# Patient Record
Sex: Male | Born: 1945 | Race: White | Hispanic: No | Marital: Married
Health system: Southern US, Community
[De-identification: ages and names within clinical notes are randomized; demographics above are authoritative.]

## PROBLEM LIST (undated history)

## (undated) DIAGNOSIS — I10 Essential (primary) hypertension: Secondary | ICD-10-CM

## (undated) DIAGNOSIS — R42 Dizziness and giddiness: Secondary | ICD-10-CM

## (undated) HISTORY — DX: Dizziness and giddiness: R42

## (undated) HISTORY — DX: Essential (primary) hypertension: I10

---

## 2004-07-21 ENCOUNTER — Ambulatory Visit: Payer: Self-pay | Admitting: Internal Medicine

## 2007-07-08 ENCOUNTER — Other Ambulatory Visit: Payer: Self-pay

## 2007-07-08 ENCOUNTER — Emergency Department: Payer: Self-pay | Admitting: Emergency Medicine

## 2009-02-19 IMAGING — CT CT HEAD WITHOUT CONTRAST
2 series · 15 of 30 positions shown, 19 images · non-contrast
Comparison: none

REASON FOR EXAM: Sinus Congestion and Headache
COMMENTS:   LMP: Post-Menopausal

[Series 2: without · axial · non-contrast · 0.40mm/px · z∈[+870,+990]mm · 13 of 29 slices shown, 17 images]
[im 3/29  brain]
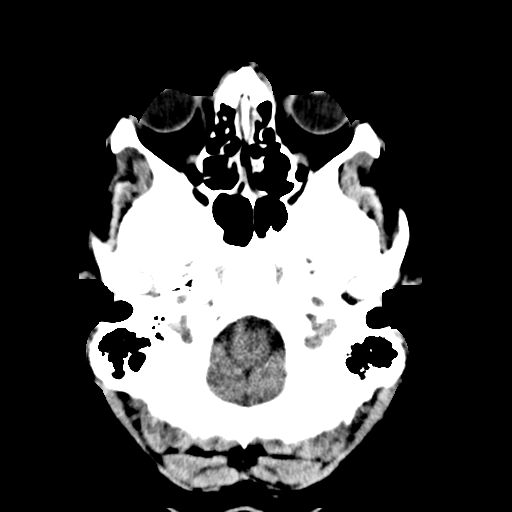
[im 3/29  bone]
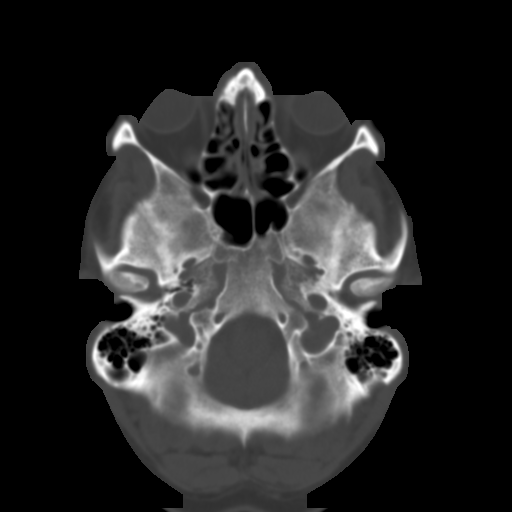
[im 5/29  brain]
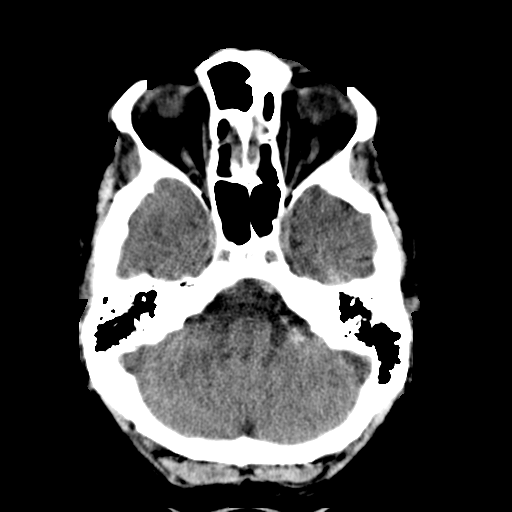
[im 7/29  brain]
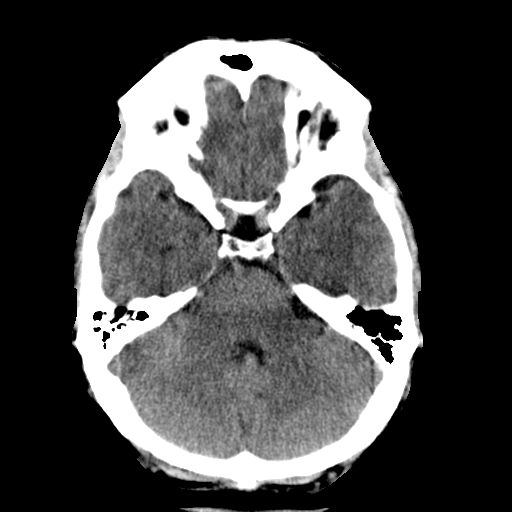
[im 9/29  brain]
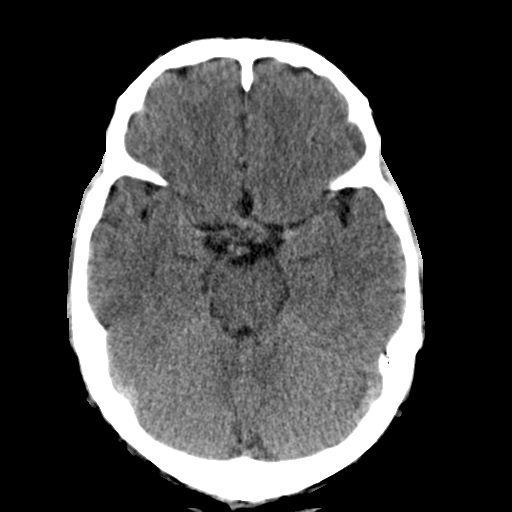
[im 11/29  brain]
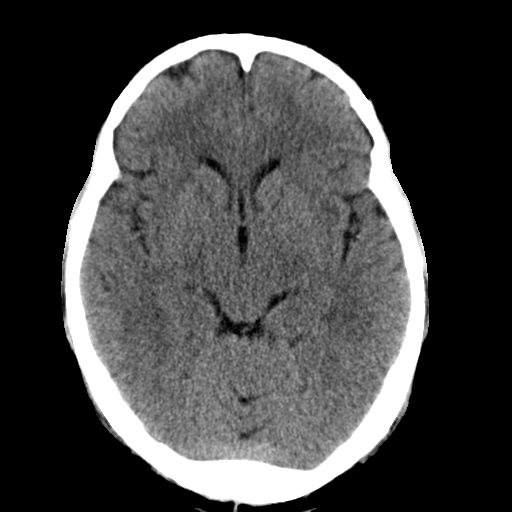
[im 11/29  bone]
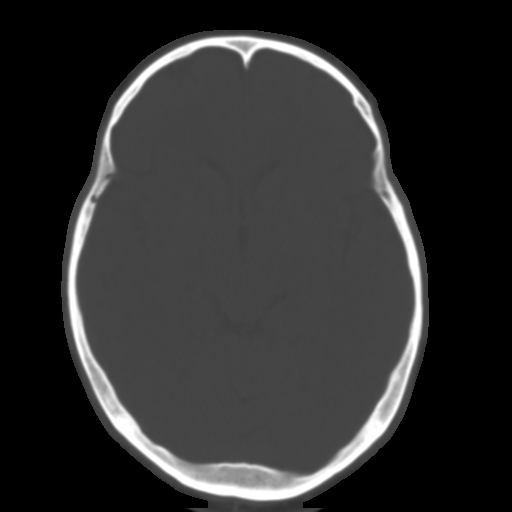
[im 13/29  brain]
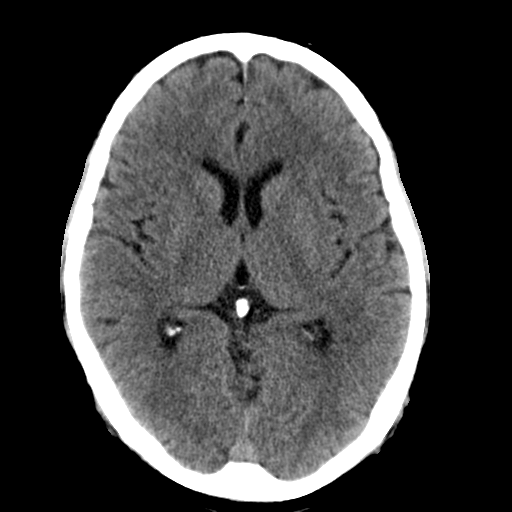
[im 15/29  brain]
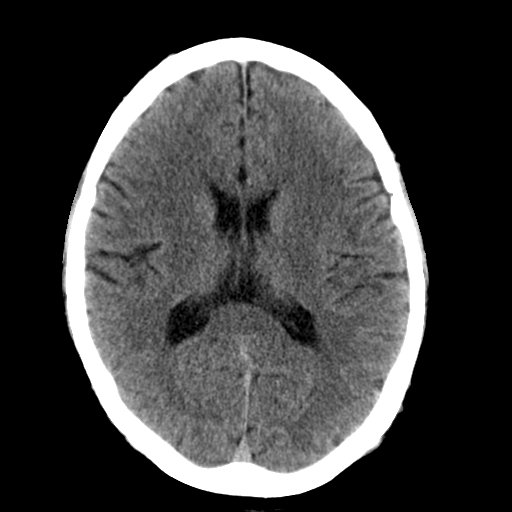
[im 17/29  brain]
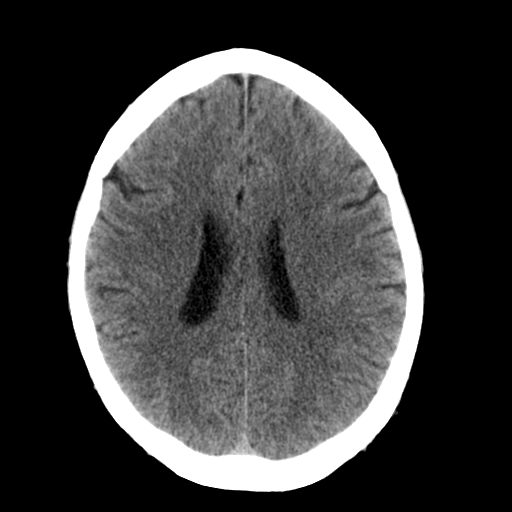
[im 19/29  brain]
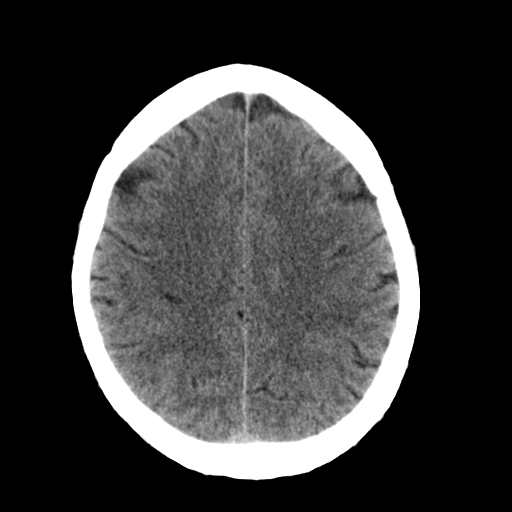
[im 19/29  bone]
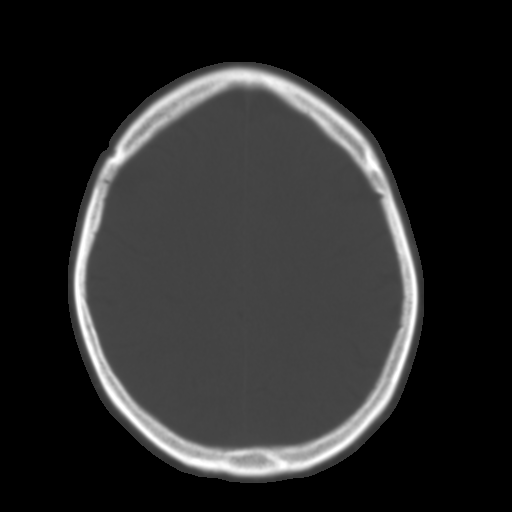
[im 21/29  brain]
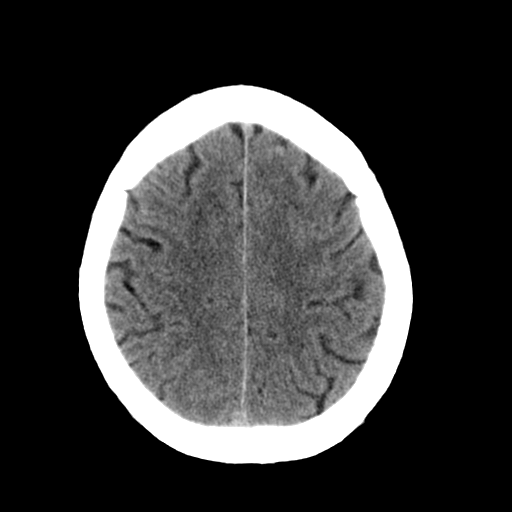
[im 23/29  brain]
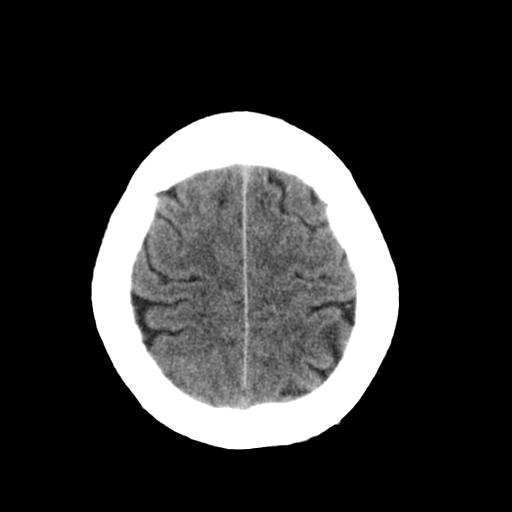
[im 25/29  brain]
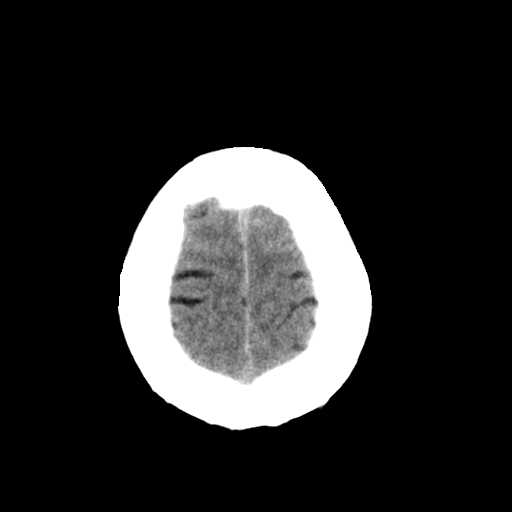
[im 27/29  brain]
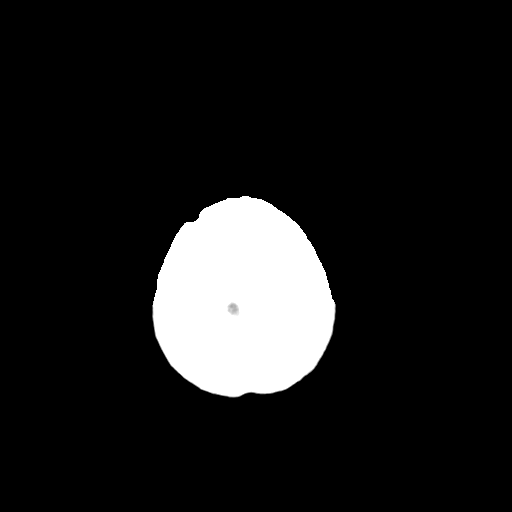
[im 27/29  bone]
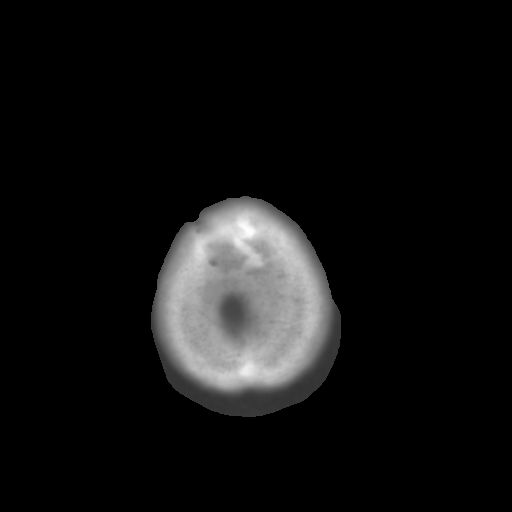

[Series 3: bone · axial · 0.40mm/px · z∈[+870,+890]mm · 2 of 29 slices shown]
[im 3/29  bone]
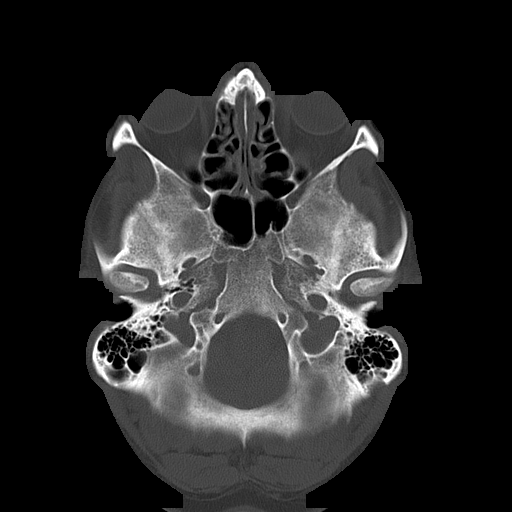
[im 7/29  bone]
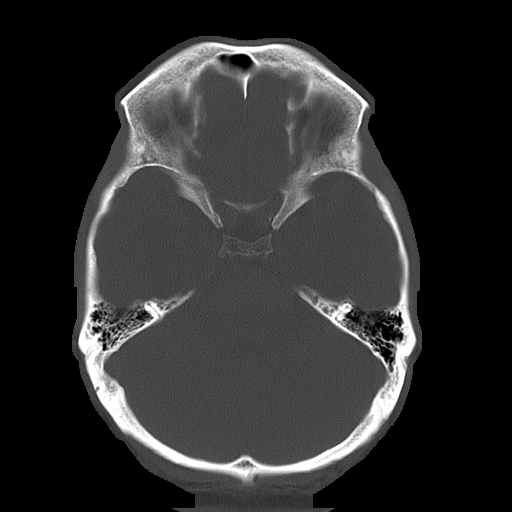

[15 of 30 positions shown; findings below may reference images not displayed]

PROCEDURE:     CT  - CT HEAD WITHOUT CONTRAST  - July 08, 2007  [DATE]

RESULT:     Emergent noncontrast CT of the brain is performed in the
standard fashion. The patient has no prior study for comparison.

 The ventricles and sulci are normal. There is no hemorrhage. There is no
focal mass, mass-effect or midline shift. There is no evidence of edema or
territorial infarct. The bone windows demonstrate normal aeration of the
paranasal sinuses and mastoid air cells. There is no skull fracture
demonstrated. There is some very mild mucosal thickening in the ethmoid
sinuses without evidence of an air-fluid level or complete opacification.
The appearance is nonspecific.
IMPRESSION: 1. No acute intracranial abnormality.
2. Mucosal thickening in the ethmoid sinuses bilaterally.
3. Incidental note is made of an aplastic left frontal sinus.

## 2010-09-25 ENCOUNTER — Emergency Department: Payer: Self-pay | Admitting: Emergency Medicine

## 2011-01-20 ENCOUNTER — Ambulatory Visit: Payer: Self-pay | Admitting: Family Medicine

## 2011-04-09 ENCOUNTER — Emergency Department: Payer: Self-pay | Admitting: Emergency Medicine

## 2012-01-03 ENCOUNTER — Ambulatory Visit: Payer: Self-pay

## 2012-11-08 ENCOUNTER — Ambulatory Visit: Payer: Self-pay | Admitting: Family Medicine

## 2014-05-22 ENCOUNTER — Emergency Department: Payer: Self-pay | Admitting: Emergency Medicine

## 2014-05-22 LAB — COMPREHENSIVE METABOLIC PANEL
ANION GAP: 5 — AB (ref 7–16)
Albumin: 3.9 g/dL (ref 3.4–5.0)
Alkaline Phosphatase: 68 U/L
BUN: 10 mg/dL (ref 7–18)
Bilirubin,Total: 0.5 mg/dL (ref 0.2–1.0)
CHLORIDE: 109 mmol/L — AB (ref 98–107)
CO2: 25 mmol/L (ref 21–32)
CREATININE: 1.16 mg/dL (ref 0.60–1.30)
Calcium, Total: 8.1 mg/dL — ABNORMAL LOW (ref 8.5–10.1)
GLUCOSE: 105 mg/dL — AB (ref 65–99)
Osmolality: 277 (ref 275–301)
POTASSIUM: 3.9 mmol/L (ref 3.5–5.1)
SGOT(AST): 31 U/L (ref 15–37)
SGPT (ALT): 29 U/L
SODIUM: 139 mmol/L (ref 136–145)
TOTAL PROTEIN: 7.2 g/dL (ref 6.4–8.2)

## 2014-05-22 LAB — CBC
HCT: 41.8 % (ref 40.0–52.0)
HGB: 13.9 g/dL (ref 13.0–18.0)
MCH: 30.7 pg (ref 26.0–34.0)
MCHC: 33.2 g/dL (ref 32.0–36.0)
MCV: 92 fL (ref 80–100)
Platelet: 205 10*3/uL (ref 150–440)
RBC: 4.52 10*6/uL (ref 4.40–5.90)
RDW: 12.8 % (ref 11.5–14.5)
WBC: 13.6 10*3/uL — ABNORMAL HIGH (ref 3.8–10.6)

## 2014-05-22 LAB — TROPONIN I

## 2016-01-04 IMAGING — CT CT HEAD WITHOUT CONTRAST
1 series · 16 of 30 positions shown, 20 images · non-contrast
Comparison: None.

CLINICAL DATA: Sudden onset of dizziness, nausea and vomiting

EXAM:
CT HEAD WITHOUT CONTRAST
TECHNIQUE: Contiguous axial images were obtained from the base of the skull
through the vertex without intravenous contrast.

[Series 2: head wo · axial · 0.45mm/px · z∈[+171,+315]mm · 16 of 36 slices shown, 20 images]
[im 2/36  brain]
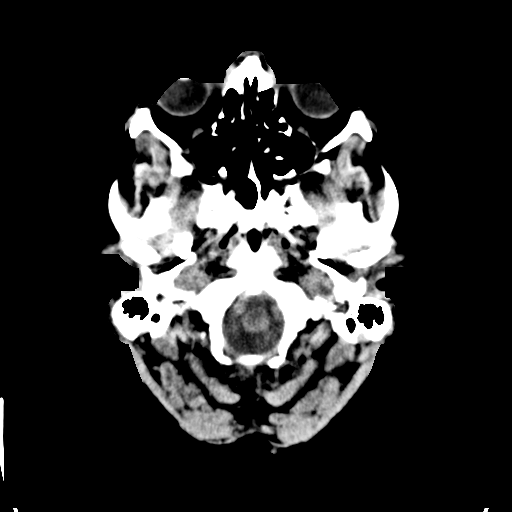
[im 2/36  bone]
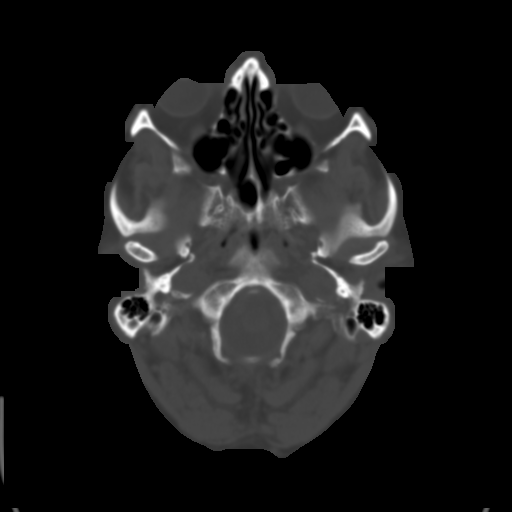
[im 4/36  brain]
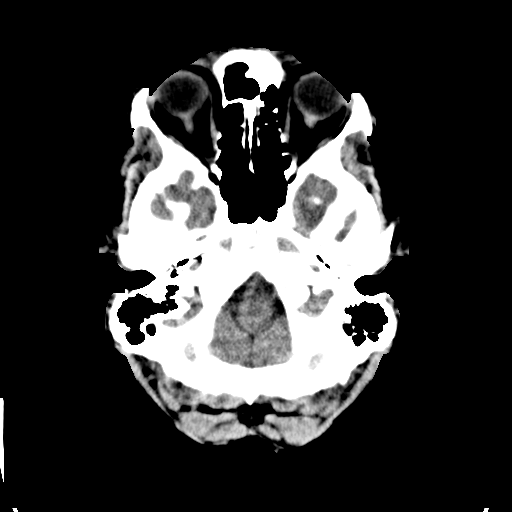
[im 7/36  brain]
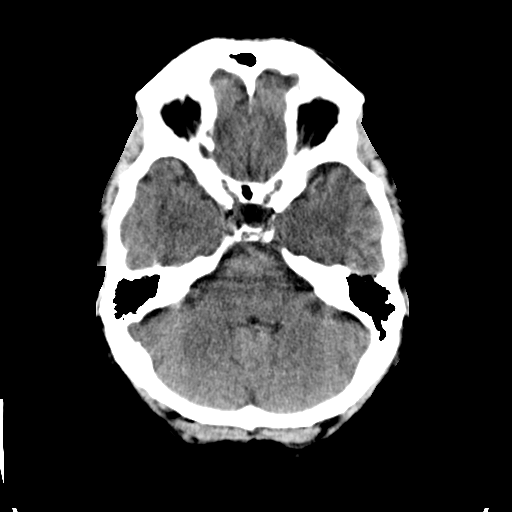
[im 9/36  brain]
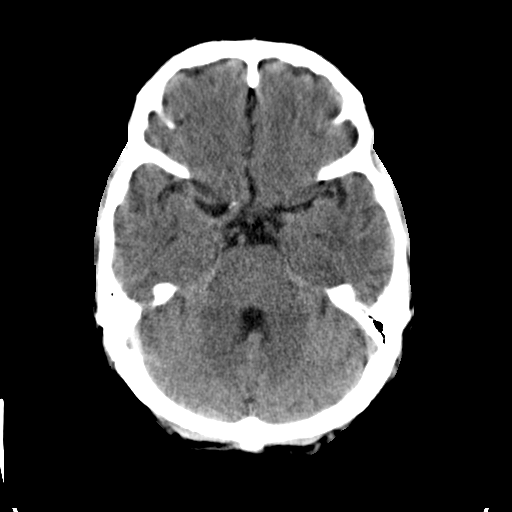
[im 10/36  brain]
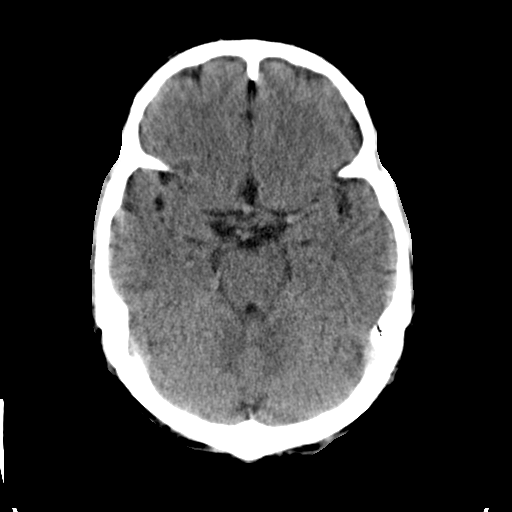
[im 10/36  bone]
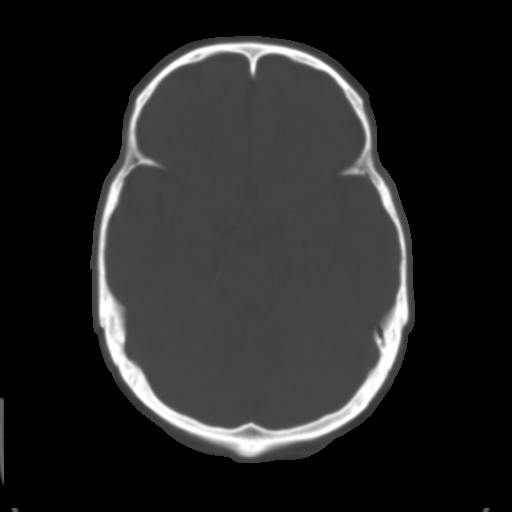
[im 13/36  brain]
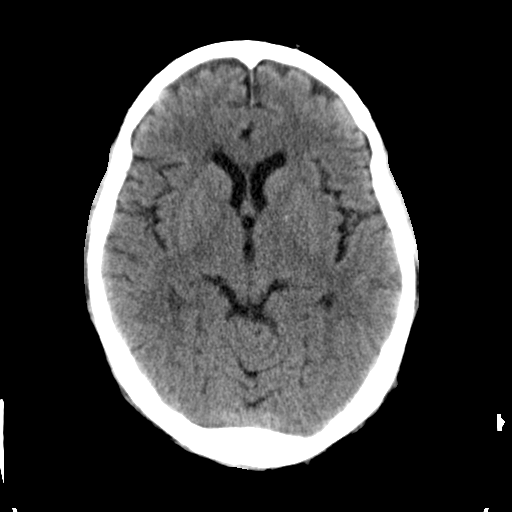
[im 15/36  brain]
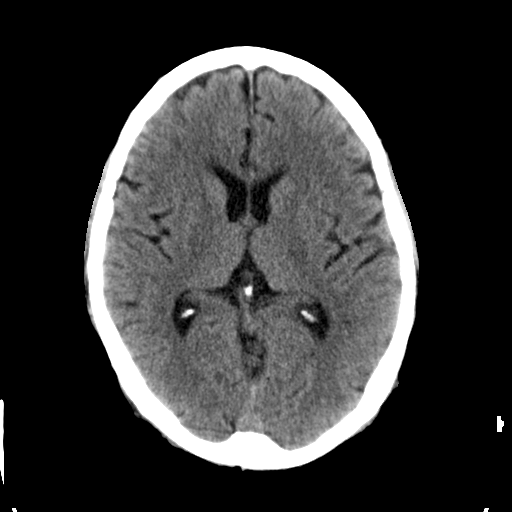
[im 17/36  brain]
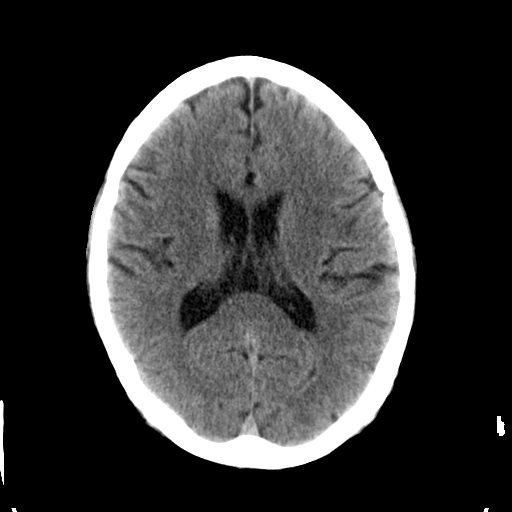
[im 19/36  brain]
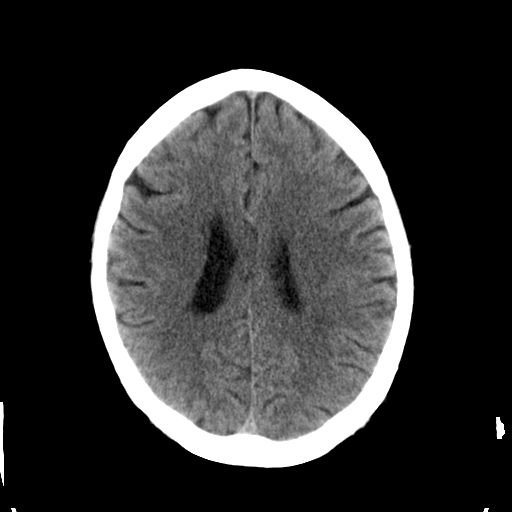
[im 19/36  bone]
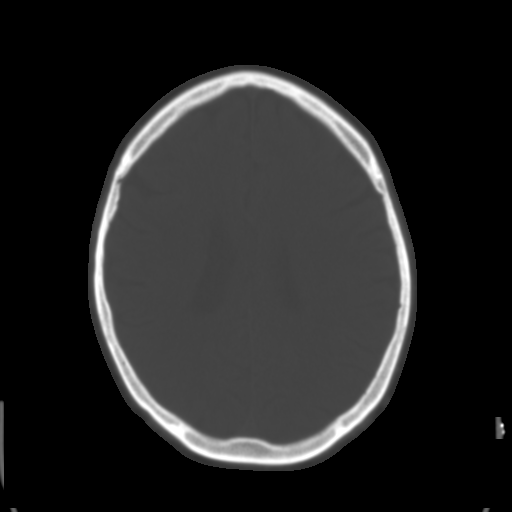
[im 21/36  brain]
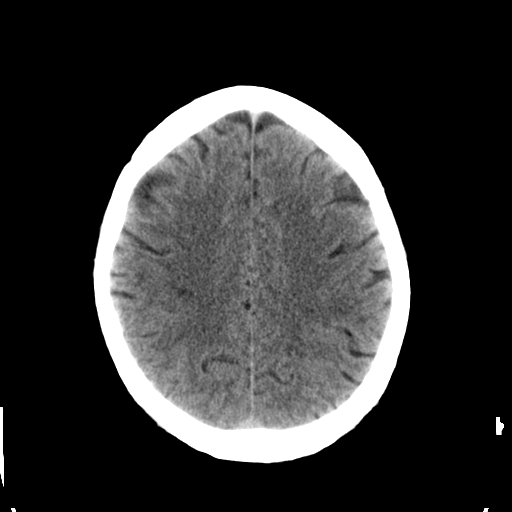
[im 23/36  brain]
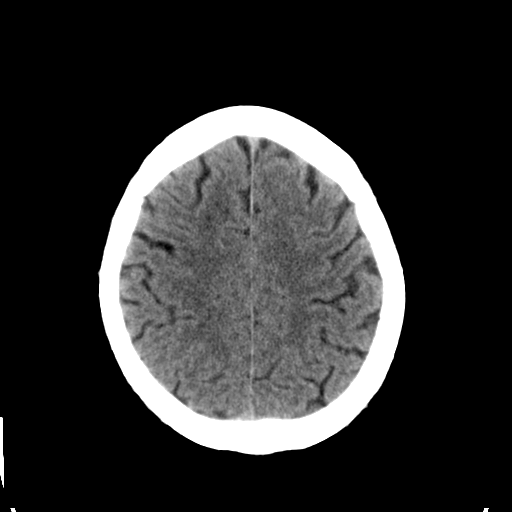
[im 26/36  brain]
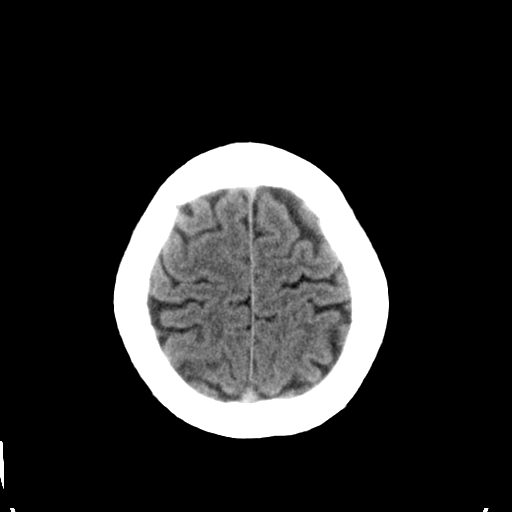
[im 27/36  brain]
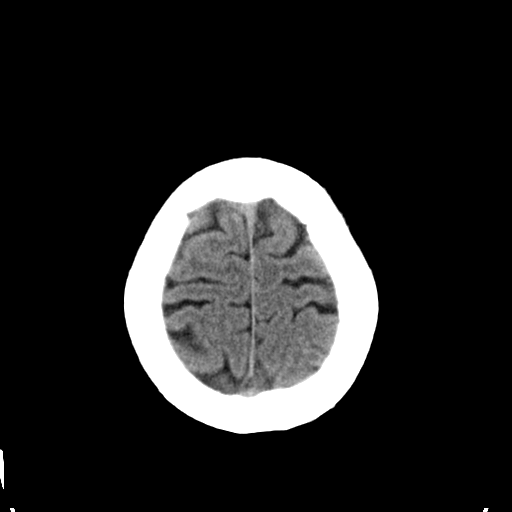
[im 27/36  bone]
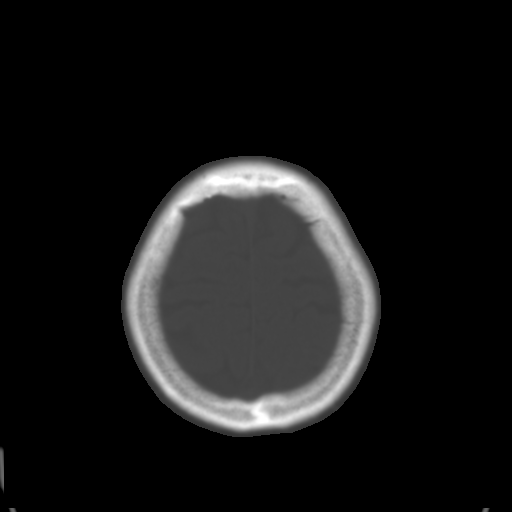
[im 29/36  brain]
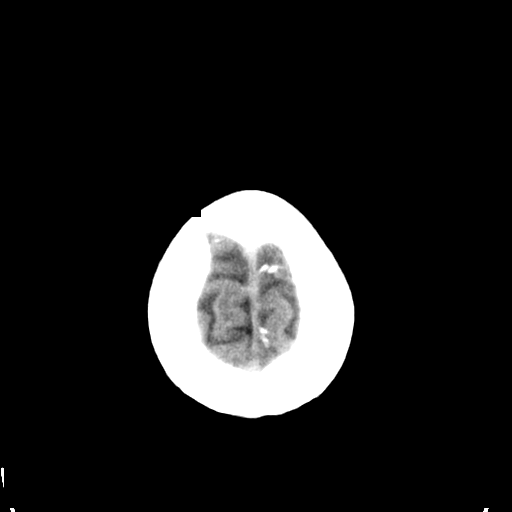
[im 32/36  brain]
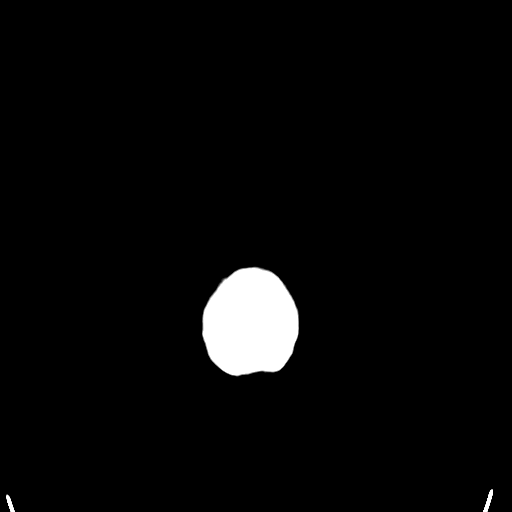
[im 34/36  brain]
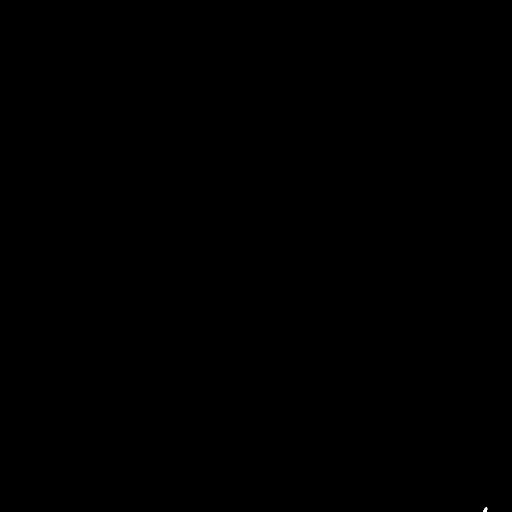

[16 of 30 positions shown; findings below may reference images not displayed]

FINDINGS: No acute hemorrhage, infarct, or mass lesion is identified. No
midline shift. Ventricles are normal in size. Orbits and paranasal
sinuses are unremarkable. No skull fracture.
IMPRESSION: No acute intracranial finding.

## 2019-08-30 ENCOUNTER — Ambulatory Visit: Payer: Medicare Other | Attending: Internal Medicine

## 2019-08-30 DIAGNOSIS — Z23 Encounter for immunization: Secondary | ICD-10-CM | POA: Insufficient documentation

## 2019-08-30 NOTE — Progress Notes (Signed)
   Covid-19 Vaccination Clinic  Name:  Carlos Howard    MRN: [id number] DOB: Dec 22, 1945  08/30/2019  [name] was observed post Covid-19 immunization for 15 minutes without incidence. He was provided with Vaccine Information Sheet and instruction to access the V-Safe system.   [name] was instructed to call 911 with any severe reactions post vaccine: Marland Kitchen Difficulty breathing  . Swelling of your face and throat  . A fast heartbeat  . A bad rash all over your body  . Dizziness and weakness    Immunizations Administered    Name Date Dose VIS Date Route   Pfizer COVID-19 Vaccine 08/30/2019 10:25 AM 0.3 mL 06/26/2019 Intramuscular   Manufacturer: [employer], [employer]   Lot: [id number]   NDC: [address]-3

## 2019-09-24 ENCOUNTER — Ambulatory Visit: Payer: Medicare Other | Attending: Internal Medicine

## 2019-09-24 DIAGNOSIS — Z20822 Contact with and (suspected) exposure to covid-19: Secondary | ICD-10-CM

## 2019-09-25 LAB — NOVEL CORONAVIRUS, NAA: SARS-CoV-2, NAA: NOT DETECTED

## 2019-09-29 ENCOUNTER — Ambulatory Visit: Payer: Self-pay

## 2019-10-06 ENCOUNTER — Ambulatory Visit: Payer: Medicare Other | Attending: Internal Medicine

## 2019-10-06 DIAGNOSIS — Z23 Encounter for immunization: Secondary | ICD-10-CM

## 2019-10-06 NOTE — Progress Notes (Signed)
   Covid-19 Vaccination Clinic  Name:  Carlos Howard    MRN: [id number] DOB: 12/03/1945  10/06/2019  [name] was observed post Covid-19 immunization for 15 minutes without incident. He was provided with Vaccine Information Sheet and instruction to access the V-Safe system.   [name] was instructed to call 911 with any severe reactions post vaccine: Marland Kitchen Difficulty breathing  . Swelling of face and throat  . A fast heartbeat  . A bad rash all over body  . Dizziness and weakness   Immunizations Administered    Name Date Dose VIS Date Route   Pfizer COVID-19 Vaccine 10/06/2019  9:22 AM 0.3 mL 06/26/2019 Intramuscular   Manufacturer: [employer], [employer]   Lot: [id number]   NDC: [address]-1

## 2024-08-14 ENCOUNTER — Ambulatory Visit
Admission: RE | Admit: 2024-08-14 | Discharge: 2024-08-14 | Disposition: A | Source: Ambulatory Visit | Attending: Family Medicine

## 2024-08-14 ENCOUNTER — Ambulatory Visit
Admission: RE | Admit: 2024-08-14 | Discharge: 2024-08-14 | Disposition: A | Attending: Family Medicine | Admitting: Family Medicine

## 2024-08-14 ENCOUNTER — Ambulatory Visit (INDEPENDENT_AMBULATORY_CARE_PROVIDER_SITE_OTHER): Admitting: Family Medicine

## 2024-08-14 ENCOUNTER — Encounter: Payer: Self-pay | Admitting: Family Medicine

## 2024-08-14 VITALS — BP 90/54 | HR 80 | Ht 65.0 in | Wt 152.0 lb

## 2024-08-14 DIAGNOSIS — G8929 Other chronic pain: Secondary | ICD-10-CM | POA: Diagnosis not present

## 2024-08-14 DIAGNOSIS — M5442 Lumbago with sciatica, left side: Secondary | ICD-10-CM | POA: Diagnosis not present

## 2024-08-14 MED ORDER — MELOXICAM 15 MG PO TABS: 15.0000 mg | ORAL_TABLET | Freq: Every day | ORAL | 0 refills | Status: AC | PRN

## 2024-08-14 NOTE — Progress Notes (Signed)
 "    Primary Care / Sports Medicine Office Visit  Patient Information:  Patient ID: Carlos Howard, male DOB: 16-Mar-1946 Age: 79 y.o. MRN: [id number]   Carlos Howard is a pleasant 79 y.o. male presenting with the following:  Chief Complaint  Patient presents with   Back Pain    Left low back pain x 3 months. Pain is right above left glute and travels down to top of left thigh with some numbness.    Hip Pain    Left hip pain x 3 months. Patient use to walk 30 minutes daily but now he can only walk about 5-10 minutes. No catching, no locking Numbness in left thigh.     Vitals:   08/14/24 1450  BP: (!) 90/54  Pulse: 80  SpO2: 97%   Vitals:   08/14/24 1450  Weight: 152 lb (68.9 kg)  Height: 5' 5 (1.651 m)   Body mass index is 25.29 kg/m.  No results found.   Independent interpretation of notes and tests performed by another provider:   None  Procedures performed:   None  Pertinent History, Exam, Impression, and Recommendations:   Discussed the use of AI scribe software for clinical note transcription with the patient, who gave verbal consent to proceed.  History of Present Illness   Carlos Howard is a 79 year old male with lumbar spondylosis who presents with progressively worsening left low back, hip, and buttock pain with associated numbness.  Lumbar and Lower Extremity Pain: - Progressive onset of left-sided low back pain radiating to the left hip and buttock since earlier this year - Intermittent numbness in the left leg - Prior episode of right-sided pain last year after lifting an object - Pain is aggravated by walking, typically starting after ten minutes and progressing to severe intensity, resulting in cessation of usual one-mile walks - Sitting does not provide relief - Pain persists throughout the day and is not alleviated by rest - Lying flat at night exacerbates symptoms; sleeps on his side for comfort - Coughing can aggravate the pain - No acute  worsening with other activities that increase intra-abdominal pressure  Functional Impact and Gait Abnormalities: - Unable to complete usual one-mile walks due to pain severity - Wife observes tendency to walk on the inside of his feet - Sometimes sits with one leg up or cross-legged, but does not attribute this to symptoms  Prior Treatments and Response: - Ibuprofen provides only partial symptomatic improvement - Muscle relaxants were ineffective - Chiropractic care, spinal manipulation, and acupuncture provided limited benefit - Chiropractic traction bed provided some relief - No recent imaging for back pain  Remote Injury and Chronic Symptoms: - Remote history of military accident in 66 - Working with the [place] regarding possible overuse injury and contralateral weakness over the past fifty years  Nocturnal Symptoms: - Lying flat at night worsens pain; prefers to sleep on his side  No chest pain, shortness of breath, or palpitations.      Physical Exam LUMBOSACRAL SPINE AND LEFT HIP INSPECTION: Normal lumbar and pelvic alignment without deformity, swelling, or erythema. PALPATION: Spinous processes non-tender to palpation. No focal paraspinal tenderness. RANGE OF MOTION: Lumbar flexion, extension, rotation, and lateral bending preserved without pain reproduction. STRENGTH: 5/5 strength in bilateral lower extremities. NEUROLOGICAL: Sensation intact to light touch in bilateral lower extremities; no focal motor deficit. SPECIAL TESTS: Straight leg raise negative on the left. Piriformis testing negative on the left. FABER test benign on the left.  Iliopsoas testing negative on the left. Kemps test negative bilaterally.  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 No acute nerve impingement. Symptoms worsen with ambulation and sitting, limiting  mobility and daily function. - Ordered lumbar spine x-rays to assess arthritis and structural changes. - Prescribed meloxicam  15 mg once daily with food. - Recommended cyclobenzaprine at night as needed, with monitoring for grogginess and discontinuation if excessive sedation occurs. - Provided home exercise program for core stabilization, pelvic tilts, and spinal mobility. - Referred to physical therapy; coordinate coverage through [facility] community care if possible. - Advised gradual return to ambulation, starting with 10-minute intervals, increasing as tolerated. - Scheduled follow-up in six weeks to reassess symptoms and treatment response. - Instructed to notify if cyclobenzaprine causes excessive grogginess for dose adjustment or alternative therapy. - Discussed option to continue chiropractic care as adjunct if beneficial.  Hamstring muscle tightness Bilateral hamstring tightness contributing to altered biomechanics, increased lumbar spine and pelvis strain, potentially exacerbating lumbar spondylosis and affecting gait. - Provided home exercise program for hamstring stretching and flexibility. - Included instructions for core and stabilizer muscle strengthening. - Referred to physical therapy for supervised stretching and strengthening regimen. - Advised to monitor for improvement in flexibility and reduction in pain with consistent exercise.        Assessment & Plan Chronic left-sided low back pain with left-sided sciatica  Orders:   meloxicam  (MOBIC ) 15 MG tablet; Take 1 tablet (15 mg total) by mouth daily as needed for pain.   DG Lumbar Spine Complete; Future   No follow-ups on file.     Selinda JINNY Ku, MD, [facility]   Primary Care Sports Medicine Primary Care and Sports Medicine at [facility]   "

## 2024-08-14 NOTE — Patient Instructions (Signed)
" °  VISIT SUMMARY: During your visit, we discussed your progressively worsening left low back, hip, and buttock pain with associated numbness. We also addressed your hamstring muscle tightness and its impact on your condition.  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 Your symptoms worsen with walking and sitting, limiting your mobility and daily function. -We have ordered lumbar spine x-rays to assess arthritis and structural changes. -You are prescribed meloxicam  15 mg once daily with food. -You can take cyclobenzaprine at night as needed. Monitor for grogginess and discontinue if you experience excessive sedation. -Follow the home exercise program provided for core stabilization, pelvic tilts, and spinal mobility. -You are referred to physical therapy. We will coordinate coverage through [place] community care if possible. -Gradually return to walking, starting with 10-minute intervals and increasing as tolerated. -We will have a follow-up appointment in six weeks to reassess your symptoms and treatment response. -Notify us  if cyclobenzaprine causes excessive grogginess for dose adjustment or alternative therapy. -You may continue chiropractic care as an adjunct if you find it beneficial.  HAMSTRING MUSCLE TIGHTNESS: You have bilateral hamstring tightness contributing to altered biomechanics, increased lumbar spine and pelvis strain, potentially exacerbating your lumbar spondylosis and affecting your gait. -Follow the home exercise program provided for hamstring stretching and flexibility. -The program also includes instructions for core and stabilizer muscle strengthening. -You are referred to physical therapy for supervised stretching and strengthening regimen. -Monitor for improvement in flexibility and reduction in pain with consistent  exercise.    Contains text generated by [employer].   "

## 2024-09-29 ENCOUNTER — Ambulatory Visit: Admitting: Family Medicine
